# Patient Record
Sex: Male | Born: 1995 | Race: White | Hispanic: No | Marital: Single | Smoking: Never smoker
Health system: Southern US, Community
[De-identification: ages and names within clinical notes are randomized; demographics above are authoritative.]

## PROBLEM LIST (undated history)

## (undated) HISTORY — PX: KNEE SURGERY: SHX244

---
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
## 2015-10-12 ENCOUNTER — Encounter: Payer: Self-pay | Admitting: Emergency Medicine

## 2015-10-12 ENCOUNTER — Ambulatory Visit
Admission: EM | Admit: 2015-10-12 | Discharge: 2015-10-12 | Disposition: A | Discharge status: Home / Self Care | Attending: Family Medicine | Admitting: Family Medicine

## 2015-10-12 DIAGNOSIS — J029 Acute pharyngitis, unspecified: Secondary | ICD-10-CM

## 2015-10-12 LAB — RAPID STREP SCREEN (MED CTR MEBANE ONLY): STREPTOCOCCUS, GROUP A SCREEN (DIRECT): NEGATIVE

## 2015-10-12 MED ORDER — PREDNISONE 10 MG (21) PO TBPK: ORAL_TABLET | ORAL | 0 refills | Status: AC

## 2015-10-12 MED ORDER — FEXOFENADINE-PSEUDOEPHED ER 180-240 MG PO TB24: 1.0000 | ORAL_TABLET | Freq: Every day | ORAL | 0 refills | Status: AC

## 2015-10-12 NOTE — ED Triage Notes (Signed)
Patient c/o sore throat when he swallows that started last night.

## 2015-10-12 NOTE — ED Provider Notes (Signed)
[facility]    CSN: [id number] Arrival date & time: 10/12/15  1247     History   Chief Complaint Chief Complaint  Patient presents with  . Sore Throat    HPI Sergio Wood is a 20 y.o. male.   Patient's here because of sore throat. Reports irritation under right upper tonsil. He states that last night after eating a pork loin sandwich he started having pain in the right upper tonsil bed. He denies any bones has not had any fevers. Never had anything like this before. No known drug allergies knee surgery but no medical problems. He does not smoke. No pertinent past family medical history relevant to today's visit   Sore Throat  This is a new problem. The current episode started yesterday. The problem occurs constantly. Progression since onset: The pain has waxed and waned since it started yesterday. Pertinent negatives include no chest pain, no abdominal pain, no headaches and no shortness of breath. Nothing aggravates the symptoms. Nothing relieves the symptoms. Treatments tried: Gargle with water has not helped. The treatment provided no relief.    History reviewed. No pertinent past medical history.  There are no active problems to display for this patient.   Past Surgical History:  Procedure Laterality Date  . KNEE SURGERY Left        Home Medications    Prior to Admission medications   Medication Sig Start Date End Date Taking? Authorizing Provider  fexofenadine-pseudoephedrine (ALLEGRA-D ALLERGY & CONGESTION) 180-240 MG 24 hr tablet Take 1 tablet by mouth daily. 10/12/15   Hassan Rowan, MD  predniSONE (STERAPRED UNI-PAK 21 TAB) 10 MG (21) TBPK tablet Sig 6 tablet day 1, 5 tablets day 2, 4 tablets day 3,,3tablets day 4, 2 tablets day 5, 1 tablet day 6 take all tablets orally 10/12/15   Hassan Rowan, MD    Family History History reviewed. No pertinent family history.  Social History Social History  Substance Use Topics  . Smoking status: Never Smoker    . Smokeless tobacco: Never Used  . Alcohol use Yes     Allergies   Review of patient's allergies indicates no known allergies.   Review of Systems Review of Systems  HENT: Positive for sore throat.   Respiratory: Negative for shortness of breath.   Cardiovascular: Negative for chest pain.  Gastrointestinal: Negative for abdominal pain.  Neurological: Negative for headaches.  All other systems reviewed and are negative.    Physical Exam Triage Vital Signs ED Triage Vitals  Enc Vitals Group     BP 10/12/15 1310 111/68     Pulse Rate 10/12/15 1310 (!) 54     Resp 10/12/15 1310 16     Temp 10/12/15 1310 97.6 F (36.4 C)     Temp Source 10/12/15 1310 Tympanic     SpO2 10/12/15 1310 98 %     Weight 10/12/15 1309 180 lb (81.6 kg)     Height 10/12/15 1309 6\' 2"  (1.88 m)     Head Circumference --      Peak Flow --      Pain Score 10/12/15 1311 0     Pain Loc --      Pain Edu? --      Excl. in GC? --    No data found.   Updated Vital Signs BP 111/68 (BP Location: Left Arm)   Pulse (!) 54   Temp 97.6 F (36.4 C) (Tympanic)   Resp 16   Ht 6\' 2"  (  1.88 m)   Wt 180 lb (81.6 kg)   SpO2 98%   BMI 23.11 kg/m   Visual Acuity Right Eye Distance:   Left Eye Distance:   Bilateral Distance:    Right Eye Near:   Left Eye Near:    Bilateral Near:     Physical Exam  Constitutional: He appears well-developed and well-nourished.  HENT:  Head: Normocephalic and atraumatic.  Right Ear: Hearing, tympanic membrane, external ear and ear canal normal.  Left Ear: Hearing, tympanic membrane, external ear and ear canal normal.  Nose: Nose normal. No mucosal edema or rhinorrhea.  Mouth/Throat: Uvula is midline, oropharynx is clear and moist and mucous membranes are normal.    Because the pain started after eating examination was done of the tonsillar bed. On the left side with a sore throat is beefy red tonsils. The upper tonsil area was site with pain that however it was  palpated with the use of a swallowed no foreign objects were seen and removed removed or recovered. There is no sign of anything other than the redness of the tonsillar area     UC Treatments / Results  Labs (all labs ordered are listed, but only abnormal results are displayed) Labs Reviewed  RAPID STREP SCREEN (NOT AT [facility])  CULTURE, GROUP A STREP [facility])    EKG  EKG Interpretation None       Radiology No results found.  Procedures Procedures (including critical care time)  Medications Ordered in UC Medications - No data to display   Initial Impression / Assessment and Plan / UC Course  I have reviewed the triage vital signs and the nursing notes.  Pertinent labs & imaging results that were available during my care of the patient were reviewed by me and considered in my medical decision making (see chart for details).   Results for orders placed or performed during the hospital encounter of 10/12/15  Rapid strep screen  Result Value Ref Range   Streptococcus, Group A Screen (Direct) NEGATIVE NEGATIVE   Clinical Course    Because nothing can be recovered from that area and no visualization of any foreign objects in the tonsillar bed was seen well at this point time going to place him on 6 day course of prednisone, producing inflammation Allegra-D 1 tablet daily and strongly extensive dog with salt water he's just been gargling with plain water. If this continues to have trouble next week recommend either follow-up with his PCP or ENT referral strep culture is pending if the strep culture is positive in place on antibiotics  butfor now we'll hold off antibiotics. He declines work note.  Final Clinical Impressions(s) / UC Diagnoses   Final diagnoses:  Acute pharyngitis, unspecified etiology    New Prescriptions Discharge Medication List as of 10/12/2015  1:52 PM    START taking these medications   Details  fexofenadine-pseudoephedrine (ALLEGRA-D ALLERGY &  CONGESTION) 180-240 MG 24 hr tablet Take 1 tablet by mouth daily., Starting Wed 10/12/2015, Normal    predniSONE (STERAPRED UNI-PAK 21 TAB) 10 MG (21) TBPK tablet Sig 6 tablet day 1, 5 tablets day 2, 4 tablets day 3,,3tablets day 4, 2 tablets day 5, 1 tablet day 6 take all tablets orally, Normal         Hassan RowanEugene Kathlee Barnhardt, MD 10/12/15 1438

## 2015-10-15 ENCOUNTER — Telehealth: Payer: Self-pay

## 2015-10-15 LAB — CULTURE, GROUP A STREP (THRC)

## 2015-10-15 NOTE — Telephone Encounter (Signed)
Courtesy call back completed today for patient's recent visit at [facility]. Patient did not answer, left message on machine to call back with any questions or concerns.   

## 2015-10-16 ENCOUNTER — Telehealth: Payer: Self-pay

## 2015-10-16 MED ORDER — AMOXICILLIN 500 MG PO CAPS: 500.0000 mg | ORAL_CAPSULE | Freq: Two times a day (BID) | ORAL | 0 refills | Status: AC

## 2015-10-16 NOTE — Telephone Encounter (Signed)
Patient advised that strep culture was positive. Dr. Allena KatzPatel [facility] [place] [facility] [place] [facility] and patient is advised.

## 2019-08-06 ENCOUNTER — Emergency Department (HOSPITAL_COMMUNITY)
Admission: EM | Admit: 2019-08-06 | Discharge: 2019-08-07 | Disposition: A | Discharge status: Home / Self Care | Payer: Self-pay | Attending: Emergency Medicine | Admitting: Emergency Medicine

## 2019-08-06 ENCOUNTER — Emergency Department (HOSPITAL_COMMUNITY): Payer: Self-pay

## 2019-08-06 ENCOUNTER — Encounter (HOSPITAL_COMMUNITY): Payer: Self-pay | Admitting: Emergency Medicine

## 2019-08-06 DIAGNOSIS — S8992XA Unspecified injury of left lower leg, initial encounter: Secondary | ICD-10-CM

## 2019-08-06 DIAGNOSIS — Y939 Activity, unspecified: Secondary | ICD-10-CM | POA: Insufficient documentation

## 2019-08-06 DIAGNOSIS — Y92009 Unspecified place in unspecified non-institutional (private) residence as the place of occurrence of the external cause: Secondary | ICD-10-CM | POA: Insufficient documentation

## 2019-08-06 DIAGNOSIS — R2242 Localized swelling, mass and lump, left lower limb: Secondary | ICD-10-CM | POA: Insufficient documentation

## 2019-08-06 DIAGNOSIS — S80212A Abrasion, left knee, initial encounter: Secondary | ICD-10-CM

## 2019-08-06 DIAGNOSIS — W228XXA Striking against or struck by other objects, initial encounter: Secondary | ICD-10-CM | POA: Insufficient documentation

## 2019-08-06 DIAGNOSIS — Y998 Other external cause status: Secondary | ICD-10-CM | POA: Insufficient documentation

## 2019-08-06 MED ORDER — OXYCODONE-ACETAMINOPHEN 5-325 MG PO TABS
1.0000 | ORAL_TABLET | Freq: Once | ORAL | Status: AC
  Administered 2019-08-07: 1 via ORAL
  Filled 2019-08-06: qty 1

## 2019-08-06 MED ORDER — NAPROXEN 500 MG PO TABS: 500.0000 mg | ORAL_TABLET | Freq: Two times a day (BID) | ORAL | 0 refills | Status: AC

## 2019-08-06 MED ORDER — BACITRACIN ZINC 500 UNIT/GM EX OINT
1.0000 "application " | TOPICAL_OINTMENT | Freq: Once | CUTANEOUS | Status: DC
  Filled 2019-08-06: qty 2.7
  Filled 2019-08-06: qty 0.9

## 2019-08-06 MED ORDER — CEPHALEXIN 500 MG PO CAPS: 500.0000 mg | ORAL_CAPSULE | Freq: Four times a day (QID) | ORAL | 0 refills | Status: AC

## 2019-08-06 MED ORDER — MORPHINE SULFATE (PF) 4 MG/ML IV SOLN
4.0000 mg | Freq: Once | INTRAVENOUS | Status: AC
  Administered 2019-08-06: 4 mg via INTRAVENOUS
  Filled 2019-08-06: qty 1

## 2019-08-06 NOTE — Discharge Instructions (Signed)
Take the medications as needed for pain.  Follow-up with the orthopedic doctor for further evaluation use the crutches and immobilizer for support
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
## 2019-08-07 NOTE — ED Notes (Signed)
Wound briskly irrigated with normal saline, pressure held after irrigation until hemorrhage controlled. Bacitracin telfa, kling, kerlex then ace wrap applied to leg to secure dressing. Ortho tech at bedside, knee immobilizer applied and crutches provided.  Pt medicated for pain per MD verbal order. Mother at bedside.

## 2019-08-07 NOTE — Progress Notes (Signed)
Orthopedic Tech Progress Note Patient Details:  Sergio Wood 08-06-1995 [id number]  Ortho Devices Type of Ortho Device: Crutches, Knee Immobilizer Ortho Device/Splint Location: RLE Ortho Device/Splint Interventions: Application, Adjustment   Post Interventions Patient Tolerated: Well Instructions Provided: Adjustment of device, Poper ambulation with device   Sergio Wood 08/07/2019, 12:34 AM

## 2021-08-26 IMAGING — CR DG FEMUR 2+V*R*
4 series · 4 of 4 positions shown · non-contrast
Comparison: None.

CLINICAL DATA: Status post trauma.

EXAM:
RIGHT FEMUR 2 VIEWS

[femur ap (1 of 2)]
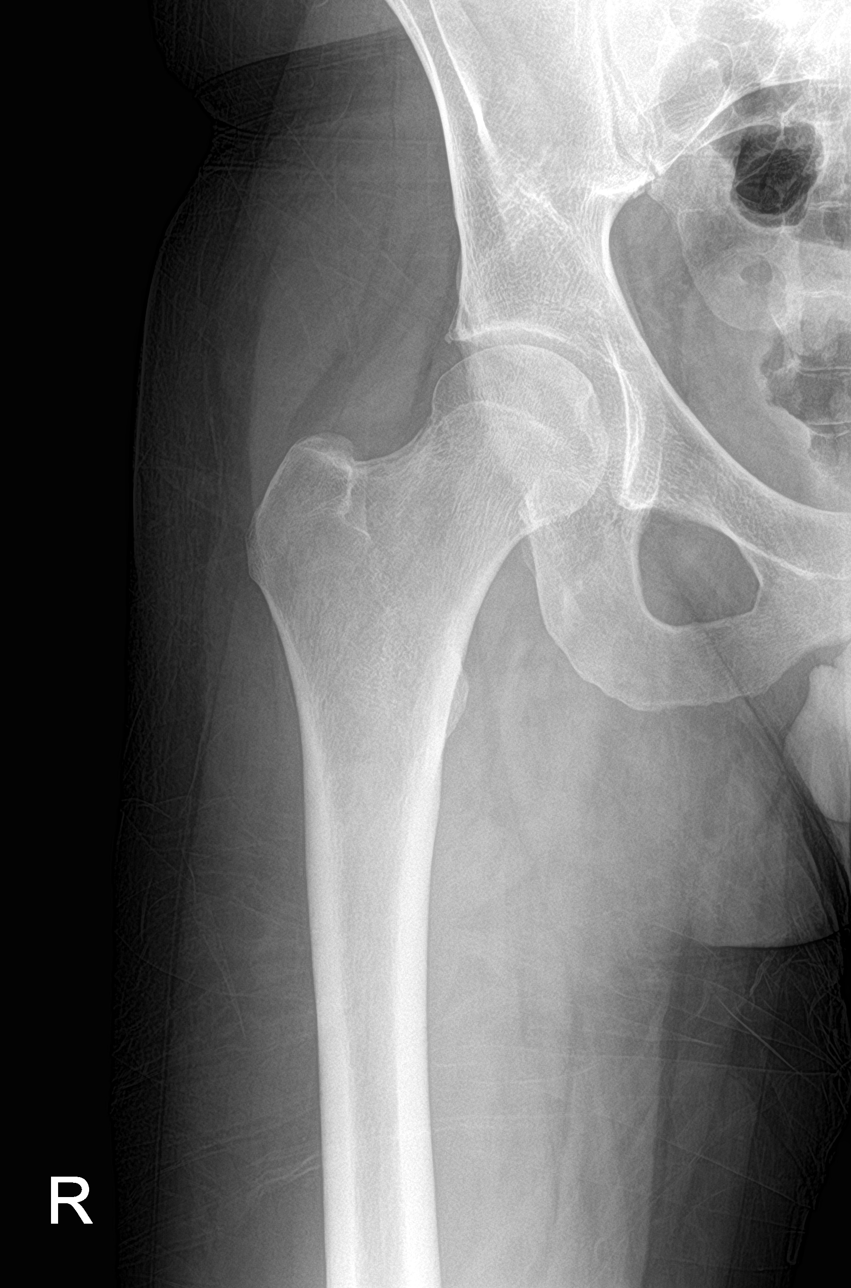

[femur ap (2 of 2)]
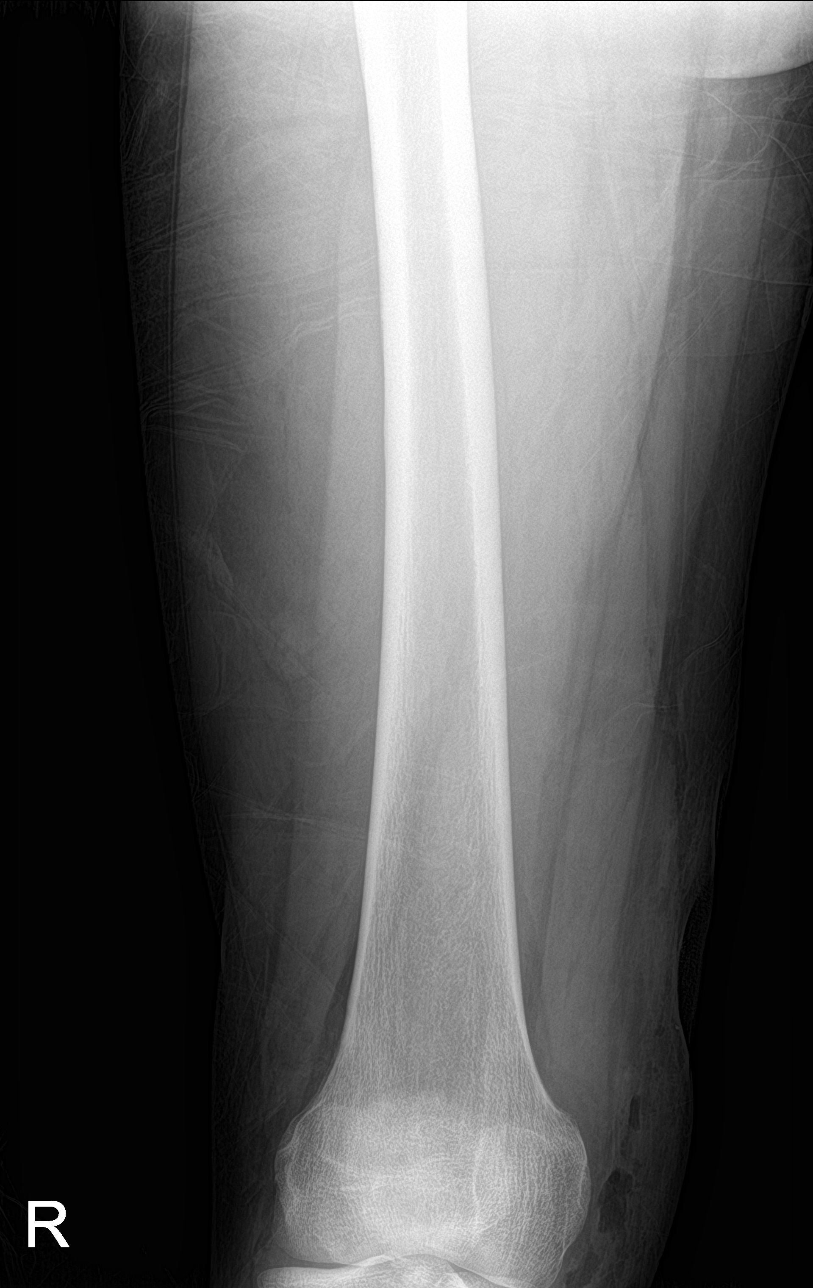

[femur lat (1 of 2)]
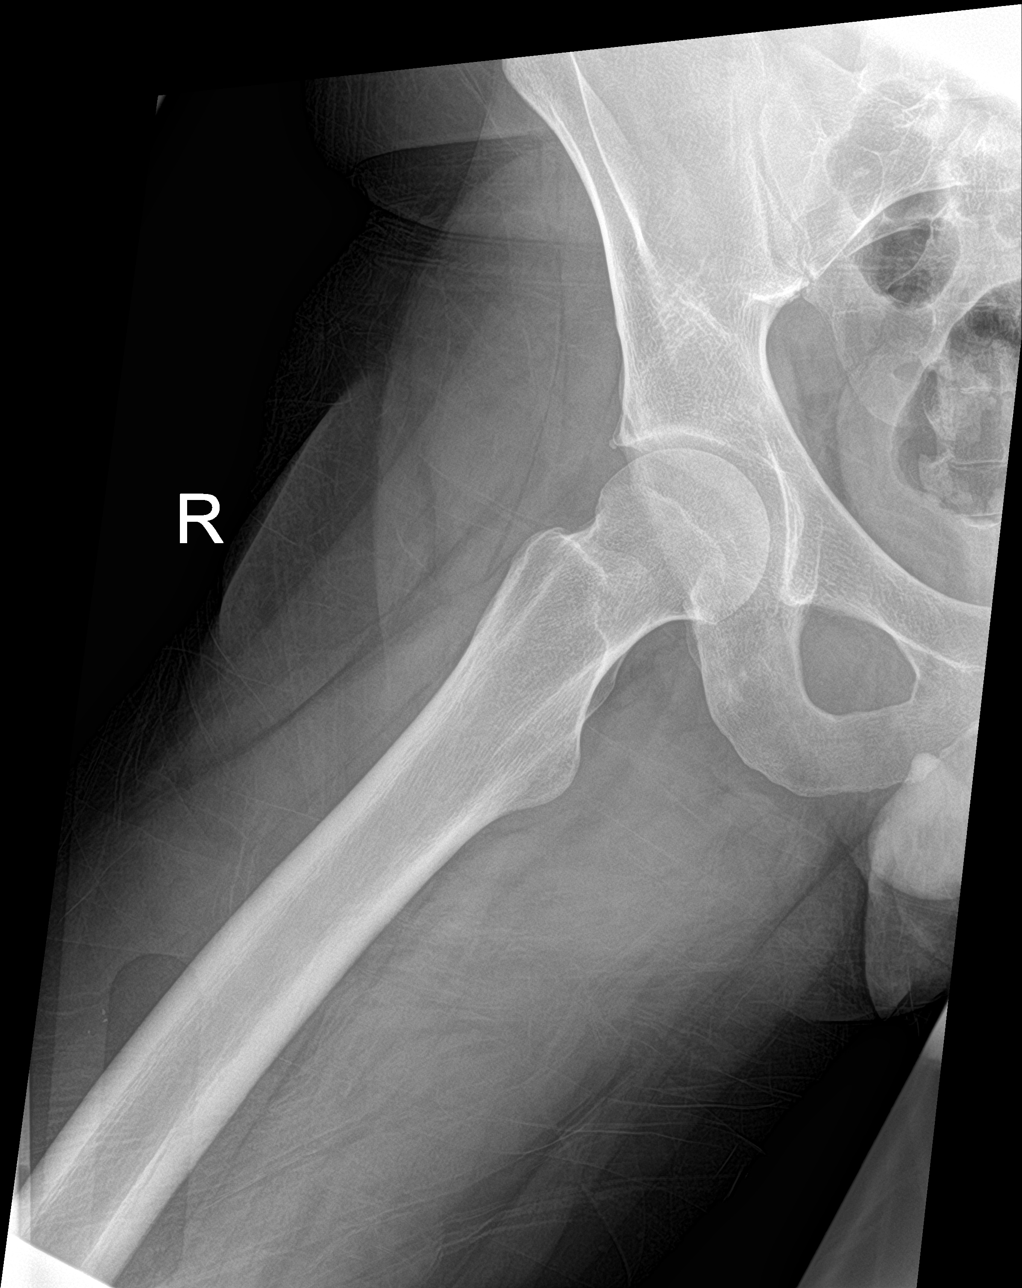

[femur lat (2 of 2)]
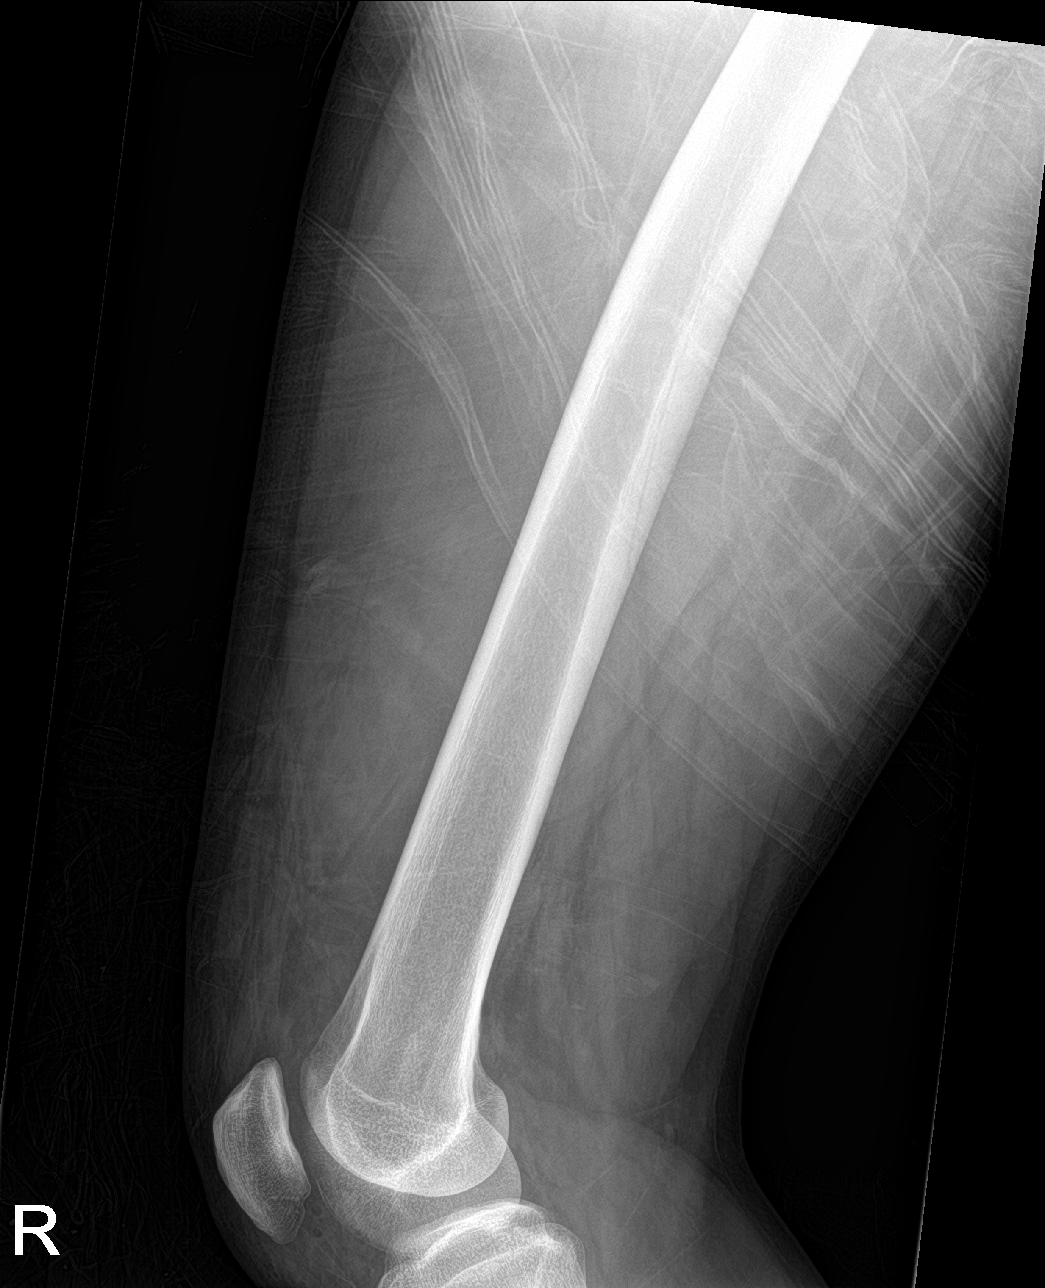

[4 of 4 positions shown; findings below may reference images not displayed]

FINDINGS: There is no evidence of fracture or other focal bone lesions.
Moderate severity soft tissue swelling is seen along the medial
aspect of the right knee. A moderate amount of associated soft
tissue air is also noted.
IMPRESSION: No acute osseous abnormality.
